# Patient Record
Sex: Male | Born: 1965 | Race: Black or African American | Hispanic: No | Marital: Married | Smoking: Never smoker
Health system: Southern US, Community
[De-identification: ages and names within clinical notes are randomized; demographics above are authoritative.]

## PROBLEM LIST (undated history)

## (undated) DIAGNOSIS — M359 Systemic involvement of connective tissue, unspecified: Secondary | ICD-10-CM

## (undated) DIAGNOSIS — E785 Hyperlipidemia, unspecified: Secondary | ICD-10-CM

## (undated) DIAGNOSIS — I10 Essential (primary) hypertension: Secondary | ICD-10-CM

---

## 1999-11-14 ENCOUNTER — Emergency Department (HOSPITAL_COMMUNITY): Admission: EM | Admit: 1999-11-14 | Discharge: 1999-11-14 | Payer: Self-pay | Admitting: Emergency Medicine

## 1999-11-14 ENCOUNTER — Encounter: Payer: Self-pay | Admitting: Emergency Medicine

## 2001-06-08 ENCOUNTER — Emergency Department (HOSPITAL_COMMUNITY): Admission: EM | Admit: 2001-06-08 | Discharge: 2001-06-09 | Payer: Self-pay | Admitting: Emergency Medicine

## 2002-11-28 ENCOUNTER — Encounter: Payer: Self-pay | Admitting: *Deleted

## 2002-11-28 ENCOUNTER — Emergency Department (HOSPITAL_COMMUNITY): Admission: EM | Admit: 2002-11-28 | Discharge: 2002-11-28 | Payer: Self-pay | Admitting: *Deleted

## 2019-09-27 ENCOUNTER — Other Ambulatory Visit: Payer: Self-pay | Admitting: Unknown Physician Specialty

## 2019-09-27 DIAGNOSIS — R221 Localized swelling, mass and lump, neck: Secondary | ICD-10-CM

## 2019-10-08 ENCOUNTER — Ambulatory Visit
Admission: RE | Admit: 2019-10-08 | Discharge: 2019-10-08 | Disposition: A | Discharge status: Home / Self Care | Payer: BC Managed Care – PPO | Source: Ambulatory Visit | Attending: Unknown Physician Specialty | Admitting: Unknown Physician Specialty

## 2019-10-08 ENCOUNTER — Other Ambulatory Visit: Payer: Self-pay

## 2019-10-08 DIAGNOSIS — R221 Localized swelling, mass and lump, neck: Secondary | ICD-10-CM | POA: Diagnosis not present

## 2019-10-08 HISTORY — DX: Systemic involvement of connective tissue, unspecified: M35.9

## 2019-10-08 HISTORY — DX: Essential (primary) hypertension: I10

## 2019-10-08 LAB — POCT I-STAT CREATININE: Creatinine, Ser: 1.3 mg/dL — ABNORMAL HIGH (ref 0.61–1.24)

## 2019-10-08 MED ORDER — IOHEXOL 300 MG/ML  SOLN
75.0000 mL | Freq: Once | INTRAMUSCULAR | Status: AC | PRN
  Administered 2019-10-08: 75 mL via INTRAVENOUS

## 2019-10-14 ENCOUNTER — Other Ambulatory Visit: Payer: Self-pay | Admitting: Unknown Physician Specialty

## 2019-10-14 DIAGNOSIS — K118 Other diseases of salivary glands: Secondary | ICD-10-CM

## 2019-10-19 ENCOUNTER — Other Ambulatory Visit: Payer: Self-pay | Admitting: Physician Assistant

## 2019-10-20 ENCOUNTER — Ambulatory Visit
Admission: RE | Admit: 2019-10-20 | Discharge: 2019-10-20 | Disposition: A | Discharge status: Home / Self Care | Payer: BC Managed Care – PPO | Source: Ambulatory Visit | Attending: Unknown Physician Specialty | Admitting: Unknown Physician Specialty

## 2019-10-20 ENCOUNTER — Other Ambulatory Visit: Payer: Self-pay

## 2019-10-20 DIAGNOSIS — C07 Malignant neoplasm of parotid gland: Secondary | ICD-10-CM | POA: Insufficient documentation

## 2019-10-20 DIAGNOSIS — K118 Other diseases of salivary glands: Secondary | ICD-10-CM

## 2019-10-20 HISTORY — DX: Hyperlipidemia, unspecified: E78.5

## 2019-10-20 MED ORDER — SODIUM CHLORIDE 0.9 % IV SOLN: INTRAVENOUS | Status: DC

## 2019-10-20 MED ORDER — ACETAMINOPHEN 325 MG PO TABS
ORAL_TABLET | ORAL | Status: AC
  Filled 2019-10-20: qty 2

## 2019-10-20 MED ORDER — ACETAMINOPHEN 325 MG PO TABS
650.0000 mg | ORAL_TABLET | Freq: Once | ORAL | Status: AC
  Administered 2019-10-20: 650 mg via ORAL

## 2019-10-20 NOTE — Procedures (Signed)
Interventional Radiology Procedure:   Indications:  Right parotid mass   Procedure: US guided core biopsy  Findings: Complex right parotid lesion, 3 cores obtained.   Complications: None     EBL: less than 10 ml  Plan: Discharge to home.    Celestina Gironda R. Anselm Pancoast, MD  Pager: [phone]

## 2019-10-20 NOTE — Discharge Instructions (Signed)
Needle Biopsy, Care After This sheet gives you information about how to care for yourself after your procedure. Your health care provider may also give you more specific instructions. If you have problems or questions, contact your health care provider. What can I expect after the procedure? After the procedure, it is common to have soreness, bruising, or mild pain at the puncture site. This should go away in a few days. Follow these instructions at home: Needle insertion site care   Wash your hands with soap and water before you change your bandage (dressing). If you cannot use soap and water, use hand sanitizer.  Follow instructions from your health care provider about how to take care of your puncture site. This includes: ? When and how to change your dressing. ? When to remove your dressing.  Check your puncture site every day for signs of infection. Check for: ? Redness, swelling, or pain. ? Fluid or blood. ? Pus or a bad smell. ? Warmth. General instructions  Return to your normal activities as told by your health care provider. Ask your health care provider what activities are safe for you.  Do not take baths, swim, or use a hot tub until your health care provider approves. Ask your health care provider if you may take showers. You may only be allowed to take sponge baths.  Take over-the-counter and prescription medicines only as told by your health care provider.  Keep all follow-up visits as told by your health care provider. This is important. Contact a health care provider if:  You have a fever.  You have redness, swelling, or pain at the puncture site that lasts longer than a few days.  You have fluid, blood, or pus coming from your puncture site.  Your puncture site feels warm to the touch. Get help right away if:  You have severe bleeding from the puncture site. Summary  After the procedure, it is common to have soreness, bruising, or mild pain at the puncture  site. This should go away in a few days.  Check your puncture site every day for signs of infection, such as redness, swelling, or pain.  Get help right away if you have severe bleeding from your puncture site. This information is not intended to replace advice given to you by your health care provider. Make sure you discuss any questions you have with your health care provider. Document Revised: 09/19/2017 Document Reviewed: 07/21/2017 Elsevier Patient Education  2020 [employer]  

## 2019-10-21 LAB — SURGICAL PATHOLOGY

## 2019-10-29 ENCOUNTER — Other Ambulatory Visit: Payer: Self-pay | Admitting: Unknown Physician Specialty

## 2019-10-29 DIAGNOSIS — C07 Malignant neoplasm of parotid gland: Secondary | ICD-10-CM

## 2019-11-04 ENCOUNTER — Other Ambulatory Visit: Payer: Self-pay

## 2019-11-04 ENCOUNTER — Encounter
Admission: RE | Admit: 2019-11-04 | Discharge: 2019-11-04 | Disposition: A | Discharge status: Home / Self Care | Payer: BC Managed Care – PPO | Source: Ambulatory Visit | Attending: Unknown Physician Specialty | Admitting: Unknown Physician Specialty

## 2019-11-04 DIAGNOSIS — C07 Malignant neoplasm of parotid gland: Secondary | ICD-10-CM

## 2019-11-04 LAB — GLUCOSE, CAPILLARY: Glucose-Capillary: 88 mg/dL (ref 70–99)

## 2019-11-04 MED ORDER — FLUDEOXYGLUCOSE F - 18 (FDG) INJECTION
11.1000 | Freq: Once | INTRAVENOUS | Status: AC | PRN
  Administered 2019-11-04: 13:00:00 11.6 via INTRAVENOUS

## 2019-12-06 ENCOUNTER — Other Ambulatory Visit: Payer: Self-pay

## 2019-12-06 ENCOUNTER — Ambulatory Visit: Payer: BC Managed Care – PPO | Admitting: Urology

## 2019-12-06 VITALS — BP 155/91 | HR 85 | Ht 68.0 in | Wt 208.0 lb

## 2019-12-06 DIAGNOSIS — N200 Calculus of kidney: Secondary | ICD-10-CM

## 2019-12-06 DIAGNOSIS — N2889 Other specified disorders of kidney and ureter: Secondary | ICD-10-CM

## 2019-12-06 NOTE — Progress Notes (Signed)
12/03/19 8:18 PM   Nicholas Henderson 02-Mar-1966 [id number]  Referring provider: Beverly Gust, MD [address] [place],  [place] [address] Chief Complaint  Patient presents with  . Establish Care   HPI: Nicholas Henderson is a 54 y.o. male seen in consultation at the request of Dr. Tami Ribas for evaluation of a renal abnormality seen on PET.  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 Collagen vascular disease ([place])   . Hyperlipidemia   . Hypertension     Surgical History: History reviewed. No pertinent surgical history.  Home Medications:  Allergies as of 12/06/2019   Not on File     Medication List       Accurate as of Dec 06, 2019 11:59 PM. If you have any questions, ask your nurse or doctor.        amLODipine 10 MG tablet Commonly known as: NORVASC Take 10 mg by mouth daily.   fluticasone 50 MCG/ACT nasal spray Commonly known as: FLONASE Place into the nose.   hydrochlorothiazide 25 MG tablet Commonly known as: HYDRODIURIL Take 25 mg by mouth daily.   hydroxychloroquine 200 MG tablet Commonly known as: PLAQUENIL Take 200 mg by mouth 2 (two) times daily.   levocetirizine 5 MG tablet Commonly known as: XYZAL Take 5 mg by mouth every evening.   lisinopril 40 MG tablet Commonly known as: ZESTRIL Take 40 mg by mouth daily.   potassium chloride 20 MEQ packet Commonly known as: KLOR-CON Take by mouth.   pravastatin 40 MG tablet Commonly known as: PRAVACHOL Take 40 mg by mouth daily.       Allergies: Not on File  Family History: No family history on file.   Social History:  reports that he has never smoked. He has never used smokeless tobacco. He reports current alcohol use. No history on file for drug.   Physical Exam: BP (!) 155/91   Pulse 85   Ht 5\' 8"  (1.727 m)   Wt 208 lb (94.3 kg)   BMI 31.63 kg/m   Constitutional:  Alert and oriented, No acute distress. HEENT: [place] AT, moist mucus membranes.  Trachea midline, no masses. Cardiovascular: No clubbing, cyanosis, or edema. Respiratory: Normal respiratory effort, no increased work of breathing. Neurologic: Grossly intact, no focal deficits, moving all 4 extremities. Psychiatric: Normal mood and affect.   Pertinent Imaging: Images were personally reviewed  CLINICAL DATA:  Initial treatment strategy for malignant parotid gland neoplasm. Acinar cell neoplasm on biopsy.  EXAM: NUCLEAR MEDICINE PET SKULL BASE TO THIGH  TECHNIQUE: 11.6 mCi F-18 FDG was injected intravenously. Full-ring PET imaging was performed from the skull base to thigh after the radiotracer. CT data was obtained and used for attenuation correction and anatomic localization.  Fasting blood glucose: 88 mg/dl  COMPARISON:  CT neck dated 10/08/2019  FINDINGS: Mediastinal blood pool activity: SUV max 2.6  Liver activity: SUV max NA  NECK: 2.4 cm mixed cystic/solid right parotid mass (series 3/image 25), max SUV 3.9, corresponding to known acinar cell neoplasm.  No hypermetabolic cervical lymphadenopathy.  Incidental CT findings: none  CHEST: No hypermetabolic thoracic lymphadenopathy.  No suspicious pulmonary nodules.  Incidental CT findings: none  ABDOMEN/PELVIS: No abnormal hypermetabolism in the liver, spleen, pancreas, or adrenal glands.  3.1 cm fluid density lesion in the anterior interpolar left kidney (series 3/image [id number]), hypermetabolic on PET, max SUV [id number]. The CT and PET appearance are discordant, possibly reflecting a calyceal diverticulum, but MRI abdomen with/without  contrast is suggested for further evaluation.  Additional 6 mm nonobstructing interpolar left renal calculus (series 3/image 161). Additional bilateral renal cysts. No hydronephrosis.  No hypermetabolic abdominopelvic lymphadenopathy.  Incidental CT findings: none  SKELETON: No focal hypermetabolic activity to suggest skeletal metastasis.  Incidental CT findings: none  IMPRESSION: 2.4 cm mixed cystic/solid right parotid mass, corresponding to known acinar cell neoplasm.  No findings suspicious for metastatic disease.  Hypermetabolic fluid density lesion in the anterior interpolar left kidney, indeterminate. Consider MR abdomen with/without contrast for further evaluation.   Electronically Signed   By: Julian Hy M.D.   On: 11/05/2019 08:22  Assessment & Plan:    -Renal mass 54 y.o. male with malignant lesion of the right parotid incidentally found to have a 3 cm indeterminate left renal mass on PET.  Recommend renal mass protocol MRI.  He will be notified with the results  -Nephrolithiasis Asymptomatic 6 mm left renal calculus  [facility] [address], [place], [place] [address] [phone]  I, Joneen Boers Peace, am acting as a Education administrator for Dr. Nicki Reaper C. Stoioff.  I have reviewed the above documentation for accuracy and completeness, and I agree with the above.   Abbie Sons, MD

## 2019-12-07 ENCOUNTER — Encounter: Payer: Self-pay | Admitting: Urology

## 2021-02-11 ENCOUNTER — Telehealth: Payer: BC Managed Care – PPO | Admitting: Urology

## 2021-02-11 NOTE — Telephone Encounter (Signed)
Patient was referred by ENT for a renal mass.  An MRI was recommended however was never performed.  Did he have the study performed at another facility?.  If not would recommend rescheduling

## 2021-02-12 ENCOUNTER — Encounter: Payer: Self-pay | Admitting: *Deleted

## 2021-02-12 NOTE — Telephone Encounter (Signed)
Sent my chart message , Phone number had no voice mail .

## 2021-04-03 ENCOUNTER — Encounter: Payer: Self-pay | Admitting: Urology

## 2021-04-03 NOTE — Telephone Encounter (Signed)
Letter sent.

## 2021-04-03 NOTE — Telephone Encounter (Signed)
Letter dictated regarding follow-up.  Please send certified/registered

## 2021-06-24 IMAGING — US IR BIOPSY CORE SALIVARY GLAND
1 series · 9 of 9 positions shown · non-contrast
Comparison: none

INDICATION: 53-year-old with a symptomatic right parotid mass.

[Series 1: ir biopsy core salivary gland · 0.06mm/px · 9 of 9 slices shown]
[im 1/9]
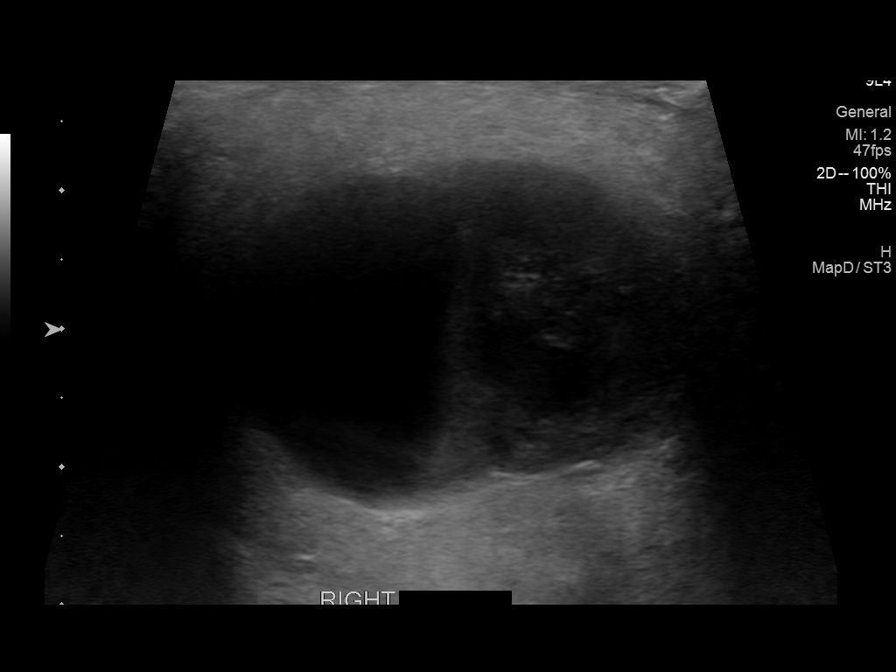
[im 2/9]
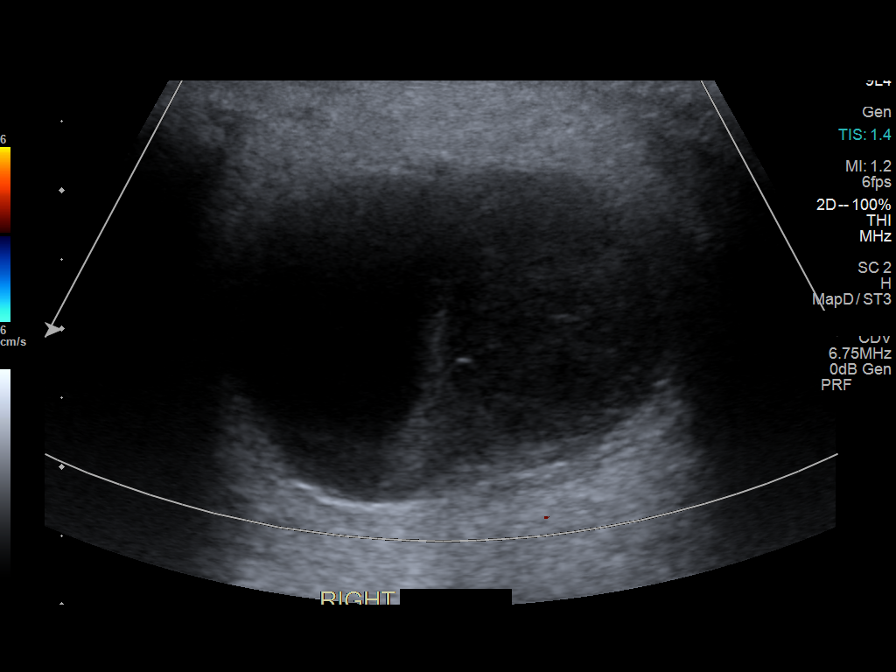
[im 3/9]
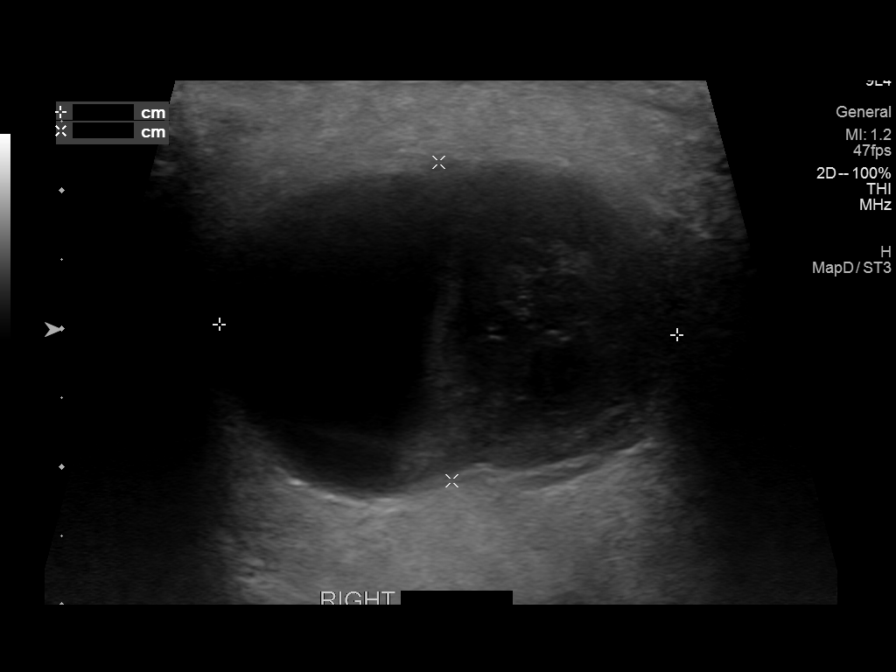
[im 4/9]
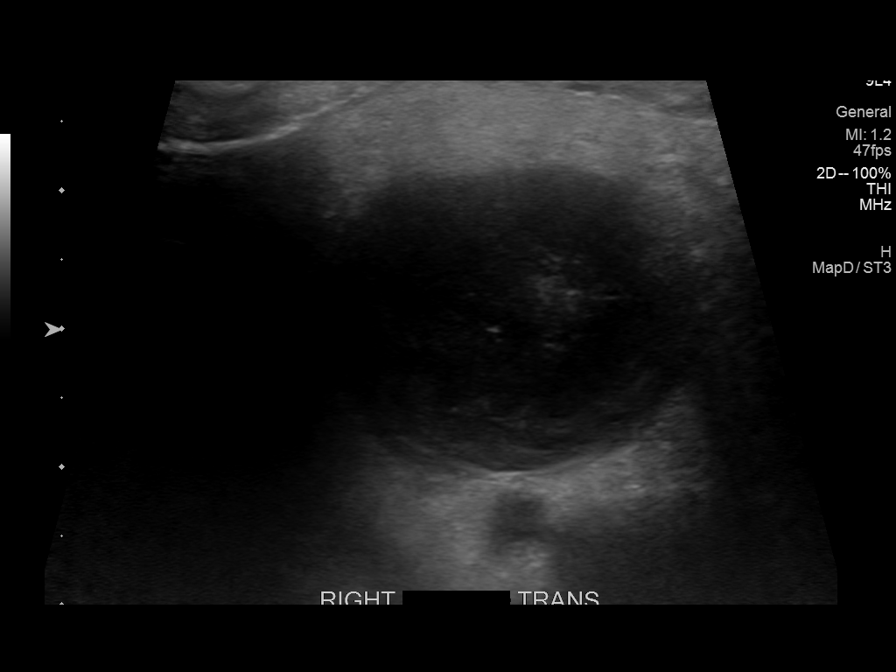
[im 5/9]
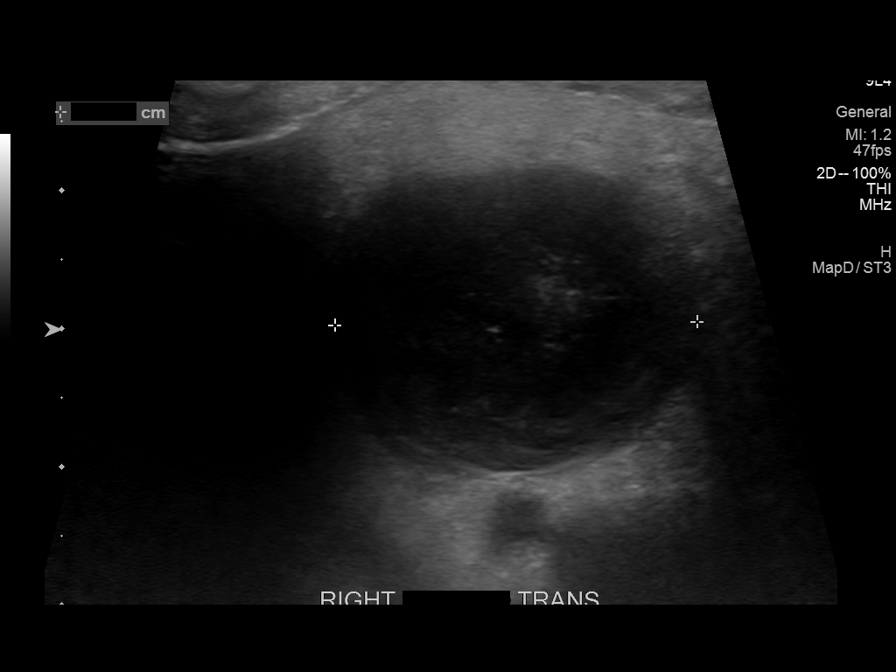
[im 6/9]
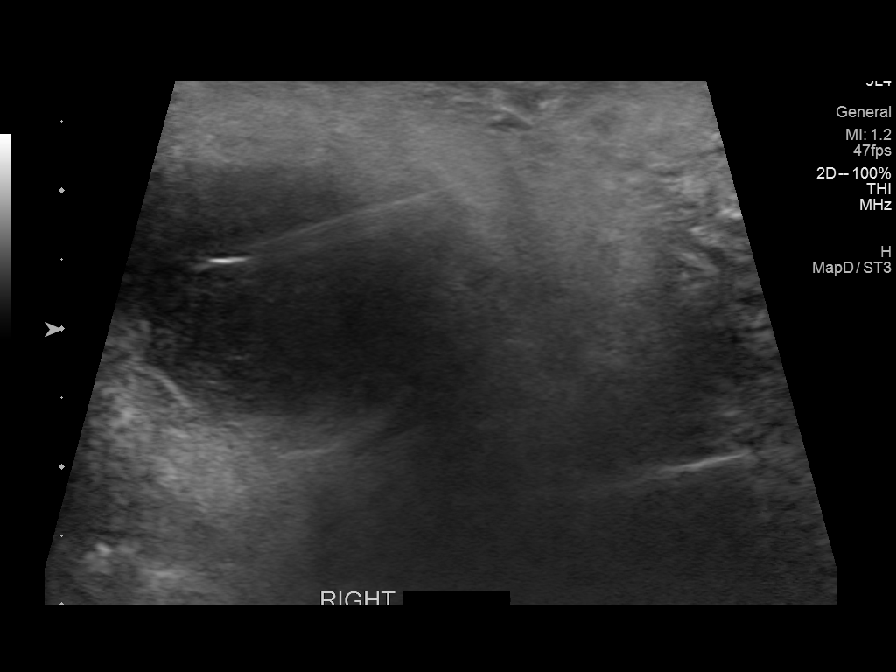
[im 7/9]
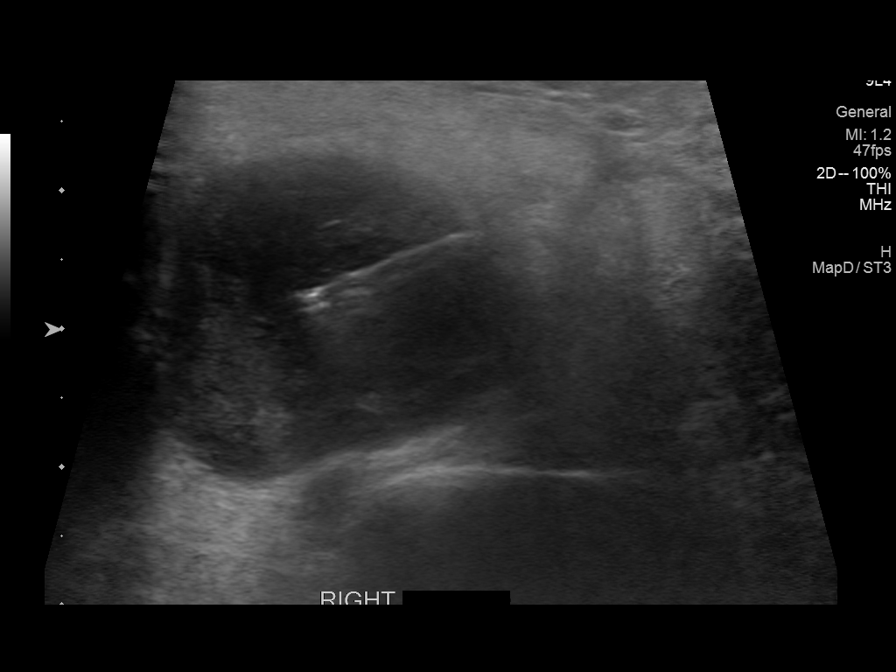
[im 8/9]
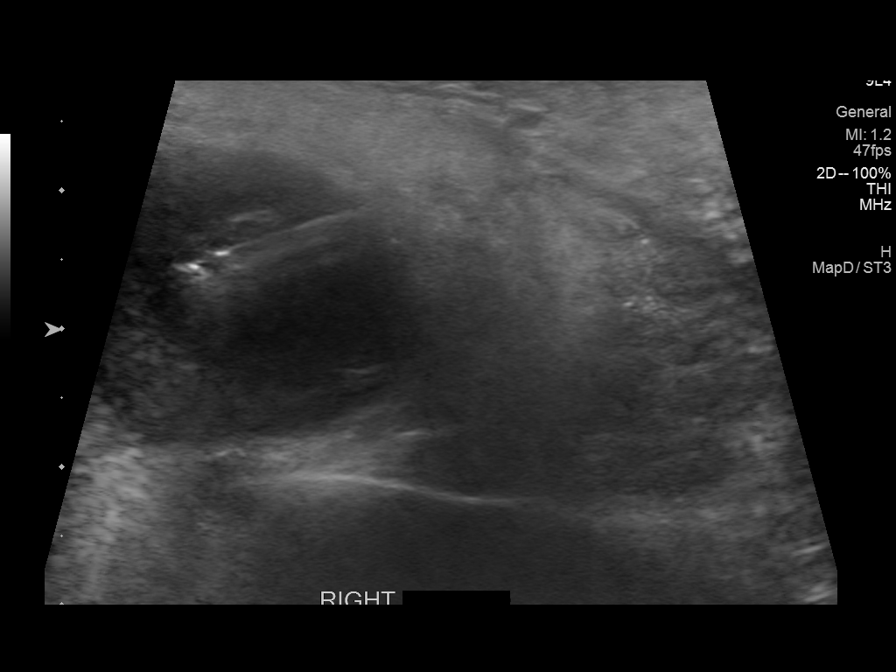
[im 9/9]
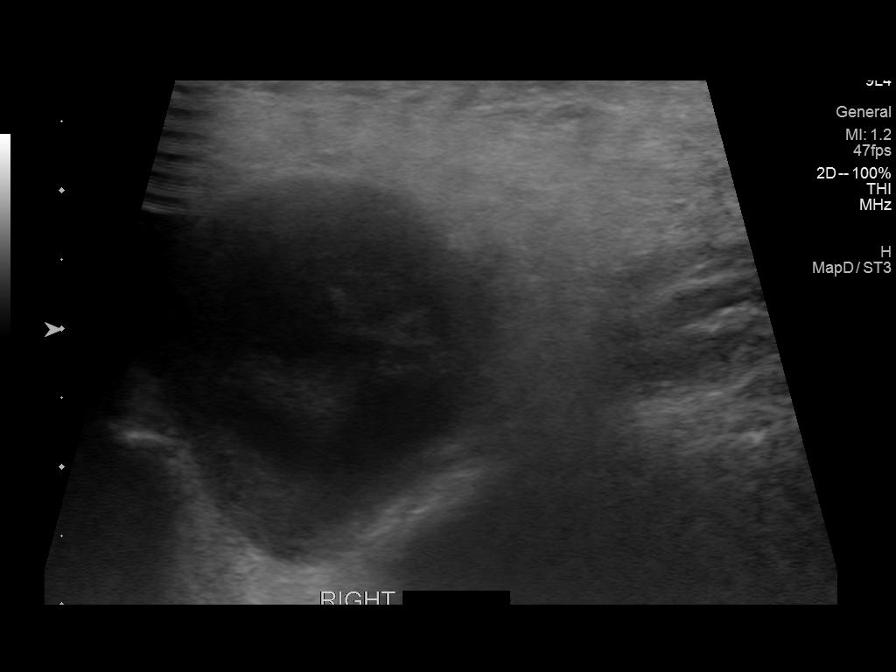

[9 of 9 positions shown; findings below may reference images not displayed]

EXAM:
ULTRASOUND-GUIDED CORE BIOPSY OF RIGHT PAROTID MASS

MEDICATIONS:
None.

ANESTHESIA/SEDATION:
None

FLUOROSCOPY TIME:  None

COMPLICATIONS:
None immediate.

PROCEDURE:
Informed written consent was obtained from the patient after a
thorough discussion of the procedural risks, benefits and
alternatives. All questions were addressed. A timeout was performed
prior to the initiation of the procedure.

Right side of the face and right upper neck was prepped with
chlorhexidine and sterile field was created. Skin and soft tissues
were anesthetized with 1% lidocaine. Small skin incision was made.
Using ultrasound guidance, 18 gauge Saxmanpr Chsparro core biopsy device
was directed into the right parotid lesion with difficulty. Small
core biopsy was obtained. Specimen was placed in formalin. It was
difficult to advance this needle back into the lesion. Therefore, an
18 gauge BioPince needle was used. This needle was more easily
advanced into the lesion with ultrasound guidance. Two additional
core biopsies were obtained. Specimens placed in formalin. No
additional core biopsies were obtained due to patient's discomfort.
Bandage placed over the puncture site.
FINDINGS: Hypoechoic lesion in the right parotid gland that measures 3.3 x
x 2.6 cm. Approximately 50% of the lesion is cystic. The more
complex and solid component was biopsied. Total of 3 core biopsies
were obtained.
IMPRESSION: Ultrasound-guided core biopsies of the right parotid mass.
